# Patient Record
Sex: Male | Born: 1975 | Race: Black or African American | Hispanic: No | Marital: Single | Smoking: Current every day smoker
Health system: Southern US, Community
[De-identification: ages and names within clinical notes are randomized; demographics above are authoritative.]

## PROBLEM LIST (undated history)

## (undated) HISTORY — PX: BRAIN SURGERY: SHX531

## (undated) HISTORY — PX: HYPOSPADIAS CORRECTION: SHX483

---

## 1998-05-12 ENCOUNTER — Emergency Department (HOSPITAL_COMMUNITY): Admission: EM | Admit: 1998-05-12 | Discharge: 1998-05-12 | Payer: Self-pay | Admitting: Emergency Medicine

## 1998-09-23 ENCOUNTER — Encounter: Payer: Self-pay | Admitting: Emergency Medicine

## 1998-09-23 ENCOUNTER — Emergency Department (HOSPITAL_COMMUNITY): Admission: EM | Admit: 1998-09-23 | Discharge: 1998-09-23 | Payer: Self-pay | Admitting: Emergency Medicine

## 1998-12-15 ENCOUNTER — Emergency Department (HOSPITAL_COMMUNITY): Admission: EM | Admit: 1998-12-15 | Discharge: 1998-12-15 | Payer: Self-pay | Admitting: Emergency Medicine

## 2001-09-16 ENCOUNTER — Emergency Department (HOSPITAL_COMMUNITY): Admission: EM | Admit: 2001-09-16 | Discharge: 2001-09-16 | Payer: Self-pay | Admitting: Emergency Medicine

## 2006-07-16 ENCOUNTER — Emergency Department (HOSPITAL_COMMUNITY): Admission: EM | Admit: 2006-07-16 | Discharge: 2006-07-16 | Payer: Self-pay | Admitting: Emergency Medicine

## 2006-08-11 ENCOUNTER — Emergency Department (HOSPITAL_COMMUNITY): Admission: EM | Admit: 2006-08-11 | Discharge: 2006-08-11 | Payer: Self-pay | Admitting: Emergency Medicine

## 2007-08-12 ENCOUNTER — Emergency Department (HOSPITAL_COMMUNITY): Admission: EM | Admit: 2007-08-12 | Discharge: 2007-08-12 | Payer: Self-pay | Admitting: *Deleted

## 2008-06-05 ENCOUNTER — Emergency Department (HOSPITAL_COMMUNITY): Admission: EM | Admit: 2008-06-05 | Discharge: 2008-06-06 | Payer: Self-pay | Admitting: Emergency Medicine

## 2009-12-10 ENCOUNTER — Emergency Department (HOSPITAL_COMMUNITY): Admission: EM | Admit: 2009-12-10 | Discharge: 2009-12-10 | Payer: Self-pay | Admitting: Emergency Medicine

## 2010-07-11 ENCOUNTER — Emergency Department (HOSPITAL_COMMUNITY): Admission: EM | Admit: 2010-07-11 | Discharge: 2010-07-11 | Payer: Self-pay | Admitting: Emergency Medicine

## 2011-06-28 ENCOUNTER — Emergency Department (HOSPITAL_COMMUNITY)
Admission: EM | Admit: 2011-06-28 | Discharge: 2011-06-28 | Disposition: A | Discharge status: Home / Self Care | Payer: Self-pay | Attending: Emergency Medicine | Admitting: Emergency Medicine

## 2011-06-28 DIAGNOSIS — H5789 Other specified disorders of eye and adnexa: Secondary | ICD-10-CM | POA: Insufficient documentation

## 2011-06-28 DIAGNOSIS — H109 Unspecified conjunctivitis: Secondary | ICD-10-CM | POA: Insufficient documentation

## 2011-09-27 LAB — DIFFERENTIAL
Basophils Absolute: 0.1
Basophils Relative: 1
Eosinophils Absolute: 0.2
Eosinophils Relative: 1
Lymphocytes Relative: 6 — ABNORMAL LOW
Lymphs Abs: 1.1
Monocytes Absolute: 1.4 — ABNORMAL HIGH
Monocytes Relative: 8
Neutro Abs: 15.4 — ABNORMAL HIGH
Neutrophils Relative %: 85 — ABNORMAL HIGH

## 2011-09-27 LAB — CBC
HCT: 40
Hemoglobin: 13.6
MCHC: 34
MCV: 100.3 — ABNORMAL HIGH
Platelets: 250
RBC: 3.99 — ABNORMAL LOW
RDW: 13.5
WBC: 18.1 — ABNORMAL HIGH

## 2011-09-27 LAB — RAPID STREP SCREEN (MED CTR MEBANE ONLY): Streptococcus, Group A Screen (Direct): POSITIVE — AB

## 2012-12-19 ENCOUNTER — Emergency Department (HOSPITAL_COMMUNITY)
Admission: EM | Admit: 2012-12-19 | Discharge: 2012-12-19 | Disposition: A | Discharge status: Home / Self Care | Payer: Self-pay | Attending: Emergency Medicine | Admitting: Emergency Medicine

## 2012-12-19 ENCOUNTER — Encounter (HOSPITAL_COMMUNITY): Payer: Self-pay | Admitting: *Deleted

## 2012-12-19 DIAGNOSIS — J069 Acute upper respiratory infection, unspecified: Secondary | ICD-10-CM | POA: Insufficient documentation

## 2012-12-19 DIAGNOSIS — R079 Chest pain, unspecified: Secondary | ICD-10-CM | POA: Insufficient documentation

## 2012-12-19 DIAGNOSIS — R0602 Shortness of breath: Secondary | ICD-10-CM | POA: Insufficient documentation

## 2012-12-19 DIAGNOSIS — J3489 Other specified disorders of nose and nasal sinuses: Secondary | ICD-10-CM | POA: Insufficient documentation

## 2012-12-19 DIAGNOSIS — F17200 Nicotine dependence, unspecified, uncomplicated: Secondary | ICD-10-CM | POA: Insufficient documentation

## 2012-12-19 DIAGNOSIS — R51 Headache: Secondary | ICD-10-CM | POA: Insufficient documentation

## 2012-12-19 DIAGNOSIS — Z7982 Long term (current) use of aspirin: Secondary | ICD-10-CM | POA: Insufficient documentation

## 2012-12-19 MED ORDER — NAPROXEN 500 MG PO TABS: 500.0000 mg | ORAL_TABLET | Freq: Two times a day (BID) | ORAL | Status: DC | PRN

## 2012-12-19 MED ORDER — GUAIFENESIN-CODEINE 100-10 MG/5ML PO SYRP: 10.0000 mL | ORAL_SOLUTION | Freq: Three times a day (TID) | ORAL | Status: DC | PRN

## 2012-12-19 MED ORDER — FEXOFENADINE-PSEUDOEPHED ER 180-240 MG PO TB24: 1.0000 | ORAL_TABLET | Freq: Every day | ORAL | Status: DC

## 2012-12-19 NOTE — ED Notes (Signed)
Woke up Tuesday  Feeling like having a cold. Having pain mostly in head - clogged, congested; also, upper abdominal pain when coughing. Productive cough- brown mucous.

## 2012-12-19 NOTE — ED Provider Notes (Signed)
History   This chart was scribed for Raeford Razor, MD by Sofie Rower, ED Scribe. The patient was seen in room TR10C/TR10C and the patient's care was started at 4:58PM.     CSN: [id number]  Arrival date & time 12/19/12  1509   First MD Initiated Contact with Patient 12/19/12 1658      Chief Complaint  Patient presents with  . Cough  . Cold Exposure    (Consider location/radiation/quality/duration/timing/severity/associated sxs/prior treatment) Patient is a 37 y.o. male presenting with cough. The history is provided by the patient. No language interpreter was used.  Cough This is a new problem. The current episode started more than 2 days ago (4 days ago). The problem occurs every few minutes. The problem has been gradually worsening. The cough is productive of brown sputum. There has been no fever. Associated symptoms include chest pain, headaches and shortness of breath. Treatments tried: alka seltzer cold formula. The treatment provided no relief. He is a smoker.    Kurt Rios is a 37 y.o. male , who presents to the Emergency Department complaining of  sudden, progressively worsening, productive brown cough, onset four days ago (12/15/12).  Associated symptoms include headache, nasal congestion, shortness of breath and chest pain. The pt has taken alka-seltzer cold, which does not provide relief of his cold like symptoms. The pt admits he has had sick contacts (Nephew), with similar symptoms.   The pt denies fever, abdominal pain, rash, and swelling located within the lower extremities. Furthermore, the pt denies any allergies to medications.    The pt is a current everyday smoker, however, he does not drink alcohol.      History reviewed. No pertinent past medical history.  Past Surgical History  Procedure Date  . Brain surgery   . Hypospadias correction     No family history on file.  History  Substance Use Topics  . Smoking status: Current Every Day Smoker  .  Smokeless tobacco: Not on file  . Alcohol Use: No      Review of Systems  Respiratory: Positive for cough and shortness of breath.   Cardiovascular: Positive for chest pain.  Neurological: Positive for headaches.  All other systems reviewed and are negative.    Allergies  Review of patient's allergies indicates no known allergies.  Home Medications   Current Outpatient Rx  Name  Route  Sig  Dispense  Refill  . GOODY HEADACHE PO   Oral   Take 1 packet by mouth daily as needed. For pain         . ALKA-SELTZER PLUS COLD PO   Oral   Take 1 packet by mouth 3 (three) times daily as needed. For pain and cold symptoms           BP 109/75  Temp 98.5 F (36.9 C) (Oral)  Resp 14  SpO2 97%  Physical Exam  Nursing note and vitals reviewed. Constitutional: He appears well-developed and well-nourished. No distress.  HENT:  Head: Normocephalic and atraumatic.       Maxillary sinus tenderness.   Eyes: Conjunctivae normal are normal. Right eye exhibits no discharge. Left eye exhibits no discharge.  Neck: Neck supple.  Cardiovascular: Normal rate, regular rhythm and normal heart sounds.  Exam reveals no gallop and no friction rub.   No murmur heard. Pulmonary/Chest: Effort normal and breath sounds normal. No respiratory distress.  Abdominal: Soft. He exhibits no distension. There is no tenderness.  Musculoskeletal: He exhibits no edema and no  tenderness.  Neurological: He is alert.  Skin: Skin is warm and dry.  Psychiatric: He has a normal mood and affect. His behavior is normal. Thought content normal.    ED Course  Procedures (including critical care time)  DIAGNOSTIC STUDIES: Oxygen Saturation is 97% on room air, normal by my interpretation.    COORDINATION OF CARE:  5:20 PM- Treatment plan discussed with patient. Pt agrees with treatment.      Labs Reviewed - No data to display No results found.   1. URI, acute       MDM  37 year old male with URI  symptoms. Likely viral illness. Very low suspicion for serious bacterial illness. No respiratory distress on exam. Plan symptomatic treatment. Emergent return precautions were discussed. Outpatient followup otherwise.      I personally preformed the services scribed in my presence. The recorded information has been reviewed is accurate. Raeford Razor, MD.    Raeford Razor, MD 12/22/12 [phone]

## 2013-01-09 ENCOUNTER — Encounter (HOSPITAL_COMMUNITY): Payer: Self-pay | Admitting: *Deleted

## 2013-01-09 ENCOUNTER — Emergency Department (HOSPITAL_COMMUNITY)
Admission: EM | Admit: 2013-01-09 | Discharge: 2013-01-09 | Disposition: A | Discharge status: Home / Self Care | Payer: Self-pay | Attending: Emergency Medicine | Admitting: Emergency Medicine

## 2013-01-09 DIAGNOSIS — H109 Unspecified conjunctivitis: Secondary | ICD-10-CM | POA: Insufficient documentation

## 2013-01-09 DIAGNOSIS — F17200 Nicotine dependence, unspecified, uncomplicated: Secondary | ICD-10-CM | POA: Insufficient documentation

## 2013-01-09 DIAGNOSIS — H538 Other visual disturbances: Secondary | ICD-10-CM | POA: Insufficient documentation

## 2013-01-09 MED ORDER — ERYTHROMYCIN 5 MG/GM OP OINT
TOPICAL_OINTMENT | Freq: Four times a day (QID) | OPHTHALMIC | Status: DC
  Administered 2013-01-09: 1 via OPHTHALMIC
  Filled 2013-01-09: qty 1

## 2013-01-09 NOTE — ED Provider Notes (Signed)
Medical screening examination/treatment/procedure(s) were performed by non-physician practitioner and as supervising physician I was immediately available for consultation/collaboration.  Johnny Gorter, MD 01/09/13 0552 

## 2013-01-09 NOTE — ED Notes (Signed)
Rt. Eye: sclera is redness, some swelling; believes it is pink eye. Woke up with a swelling shut eye.  No blurry vision.

## 2013-01-09 NOTE — ED Provider Notes (Signed)
History     CSN: [id number]  Arrival date & time 01/09/13  0158   First MD Initiated Contact with Patient 01/09/13 0208      Chief Complaint  Patient presents with  . Conjunctivitis    (Consider location/radiation/quality/duration/timing/severity/associated sxs/prior treatment) HPI  She presents to the emergency department with complaints of right eye redness, some mild swelling, itching to the eye, and waking up with his eye swollen shut yesterday morning. Informs me that he has had pinkeye before and feels like this is the same is not having pain to the eye, decreased vision, decreased range of motion. He said that he had to wash his eye to be able to get it open. 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 nad vss  History reviewed. No pertinent past medical history.  Past Surgical History  Procedure Date  . Brain surgery   . Hypospadias correction     No family history on file.  History  Substance Use Topics  . Smoking status: Current Every Day Smoker  . Smokeless tobacco: Not on file  . Alcohol Use: No      Review of Systems  All other systems reviewed and are negative.    Allergies  Review of patient's allergies indicates no known allergies.  Home Medications   Current Outpatient Rx  Name  Route  Sig  Dispense  Refill  . GOODY HEADACHE PO   Oral   Take 1 packet by mouth daily as needed. For pain           BP 111/74  Pulse 84  Temp 98.8 F (37.1 C) (Oral)  Resp 16  SpO2 98%  Physical Exam  Nursing note and vitals reviewed. Constitutional: He appears well-developed and well-nourished. No distress.  HENT:  Head: Normocephalic and atraumatic.  Eyes: EOM and lids are normal. Pupils are equal, round, and reactive to light. No foreign bodies found. Right conjunctiva is injected. Right conjunctiva has no hemorrhage.       Pt has a small amount of discharge that has dried in his eyelashes. No pain.    Neck: Normal range of motion. Neck supple.  Cardiovascular: Normal rate and regular rhythm.   Pulmonary/Chest: Effort normal.  Abdominal: Soft.  Neurological: He is alert.  Skin: Skin is warm and dry.    ED Course  Procedures (including critical care time)  Labs Reviewed - No data to display No results found.   1. Conjunctivitis       MDM  And advised to use warm moist compresses frequently. 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 I prescribed erythromycin ophthalmic ointment.  IT has been explained how contagious this is.   Pt has been advised of the symptoms that warrant their return to the ED. Patient has voiced understanding and has agreed to follow-up with the PCP or specialist.         Dorthula Matas, PA 01/09/13 [phone]

## 2013-04-12 ENCOUNTER — Encounter (HOSPITAL_COMMUNITY): Payer: Self-pay | Admitting: Emergency Medicine

## 2013-04-12 ENCOUNTER — Emergency Department (INDEPENDENT_AMBULATORY_CARE_PROVIDER_SITE_OTHER)
Admission: EM | Admit: 2013-04-12 | Discharge: 2013-04-12 | Disposition: A | Discharge status: Home / Self Care | Payer: Self-pay | Source: Home / Self Care | Attending: Emergency Medicine | Admitting: Emergency Medicine

## 2013-04-12 DIAGNOSIS — L0291 Cutaneous abscess, unspecified: Secondary | ICD-10-CM

## 2013-04-12 MED ORDER — SULFAMETHOXAZOLE-TRIMETHOPRIM 800-160 MG PO TABS: 1.0000 | ORAL_TABLET | Freq: Two times a day (BID) | ORAL | Status: AC

## 2013-04-12 MED ORDER — HYDROCODONE-ACETAMINOPHEN 5-325 MG PO TABS: 1.0000 | ORAL_TABLET | Freq: Four times a day (QID) | ORAL | Status: DC | PRN

## 2013-04-12 NOTE — ED Notes (Signed)
Pt c/o abscess near pubic area onset 2 days Hot baths alleviate the pain Pain is constant; not taking any meds Denies: f/v/nd  He is alert and oriented w/no signs of acute distress.

## 2013-04-12 NOTE — ED Provider Notes (Signed)
Medical screening examination/treatment/procedure(s) were performed by non-physician practitioner and as supervising physician I was immediately available for consultation/collaboration.  Raynald Blend, MD 04/12/13 [phone]

## 2013-04-12 NOTE — ED Provider Notes (Signed)
History     CSN: [id number]  Arrival date & time 04/12/13  1420   First MD Initiated Contact with Patient 04/12/13 1551      Chief Complaint  Patient presents with  . Abscess    (Consider location/radiation/quality/duration/timing/severity/associated sxs/prior treatment) HPI Comments: Pt with abscess in pubic hair just above pubic bone for 2 days.   Patient is a 37 y.o. male presenting with abscess. The history is provided by the patient.  Abscess Location:  Ano-genital Ano-genital abscess location: middle pubic bone area. Abscess quality: induration, painful and warmth   Abscess quality: not draining and no fluctuance   Red streaking: no   Duration:  2 days Progression:  Worsening Pain details:    Quality:  Aching   Severity:  Severe Relieved by:  Warm compresses Worsened by:  Draining/squeezing Ineffective treatments:  Draining/squeezing Associated symptoms: no fever   Risk factors: no prior abscess     History reviewed. No pertinent past medical history.  Past Surgical History  Procedure Laterality Date  . Brain surgery    . Hypospadias correction      History reviewed. No pertinent family history.  History  Substance Use Topics  . Smoking status: Current Every Day Smoker  . Smokeless tobacco: Not on file  . Alcohol Use: No      Review of Systems  Constitutional: Negative for fever and chills.  Skin:       Abscess    Allergies  Review of patient's allergies indicates no known allergies.  Home Medications   Current Outpatient Rx  Name  Route  Sig  Dispense  Refill  . Aspirin-Acetaminophen-Caffeine (GOODY HEADACHE PO)   Oral   Take 1 packet by mouth daily as needed. For pain         . HYDROcodone-acetaminophen (NORCO/VICODIN) 5-325 MG per tablet   Oral   Take 1-2 tablets by mouth every 6 (six) hours as needed for pain.   10 tablet   0   . sulfamethoxazole-trimethoprim (SEPTRA DS) 800-160 MG per tablet   Oral   Take 1 tablet by mouth 2  (two) times daily.   20 tablet   0     BP 140/72  Pulse 86  Temp(Src) 98 F (36.7 C) (Oral)  Resp 16  SpO2 100%  Physical Exam  Constitutional: He appears well-developed and well-nourished.  Appears in pain  Skin: Skin is warm and dry.       ED Course  Procedures (including critical care time)  Labs Reviewed - No data to display No results found.   1. Abscess       MDM  Elected to defer incision and drainage as there is no area of fluctuance in abscess.  Pt is to use warm compresses to area several times a day and return here in 2 days for I&D.  Rx bactrim 800/160 BID for 10 days and hydrocodone 5/325 #10 for pain.         Cathlyn Parsons, NP 04/12/13 [phone]

## 2014-10-29 ENCOUNTER — Encounter (HOSPITAL_COMMUNITY): Payer: Self-pay | Admitting: Nurse Practitioner

## 2014-10-29 ENCOUNTER — Emergency Department (HOSPITAL_COMMUNITY): Payer: No Typology Code available for payment source

## 2014-10-29 ENCOUNTER — Emergency Department (HOSPITAL_COMMUNITY)
Admission: EM | Admit: 2014-10-29 | Discharge: 2014-10-29 | Disposition: A | Discharge status: Home / Self Care | Payer: No Typology Code available for payment source | Attending: Emergency Medicine | Admitting: Emergency Medicine

## 2014-10-29 DIAGNOSIS — Z792 Long term (current) use of antibiotics: Secondary | ICD-10-CM | POA: Insufficient documentation

## 2014-10-29 DIAGNOSIS — S199XXA Unspecified injury of neck, initial encounter: Secondary | ICD-10-CM

## 2014-10-29 DIAGNOSIS — Y92410 Unspecified street and highway as the place of occurrence of the external cause: Secondary | ICD-10-CM | POA: Insufficient documentation

## 2014-10-29 DIAGNOSIS — Z720 Tobacco use: Secondary | ICD-10-CM | POA: Insufficient documentation

## 2014-10-29 DIAGNOSIS — Y998 Other external cause status: Secondary | ICD-10-CM | POA: Insufficient documentation

## 2014-10-29 DIAGNOSIS — S20219A Contusion of unspecified front wall of thorax, initial encounter: Secondary | ICD-10-CM | POA: Insufficient documentation

## 2014-10-29 DIAGNOSIS — S4991XA Unspecified injury of right shoulder and upper arm, initial encounter: Secondary | ICD-10-CM | POA: Insufficient documentation

## 2014-10-29 DIAGNOSIS — S20211A Contusion of right front wall of thorax, initial encounter: Secondary | ICD-10-CM

## 2014-10-29 DIAGNOSIS — Z7982 Long term (current) use of aspirin: Secondary | ICD-10-CM | POA: Insufficient documentation

## 2014-10-29 DIAGNOSIS — Y9389 Activity, other specified: Secondary | ICD-10-CM | POA: Insufficient documentation

## 2014-10-29 MED ORDER — NAPROXEN 500 MG PO TABS: 500.0000 mg | ORAL_TABLET | Freq: Two times a day (BID) | ORAL | Status: AC

## 2014-10-29 MED ORDER — HYDROCODONE-ACETAMINOPHEN 5-325 MG PO TABS: 1.0000 | ORAL_TABLET | Freq: Four times a day (QID) | ORAL | Status: AC | PRN

## 2014-10-29 MED ORDER — CYCLOBENZAPRINE HCL 10 MG PO TABS: 10.0000 mg | ORAL_TABLET | Freq: Two times a day (BID) | ORAL | Status: AC | PRN
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
## 2017-02-08 ENCOUNTER — Emergency Department (HOSPITAL_COMMUNITY)
Admission: EM | Admit: 2017-02-08 | Discharge: 2017-02-08 | Disposition: A | Discharge status: Home / Self Care | Payer: Self-pay | Attending: Emergency Medicine | Admitting: Emergency Medicine

## 2017-02-08 ENCOUNTER — Encounter (HOSPITAL_COMMUNITY): Payer: Self-pay | Admitting: Neurology

## 2017-02-08 DIAGNOSIS — Z7982 Long term (current) use of aspirin: Secondary | ICD-10-CM | POA: Insufficient documentation

## 2017-02-08 DIAGNOSIS — H0011 Chalazion right upper eyelid: Secondary | ICD-10-CM | POA: Insufficient documentation

## 2017-02-08 DIAGNOSIS — R21 Rash and other nonspecific skin eruption: Secondary | ICD-10-CM | POA: Insufficient documentation

## 2017-02-08 DIAGNOSIS — F17200 Nicotine dependence, unspecified, uncomplicated: Secondary | ICD-10-CM | POA: Insufficient documentation

## 2017-02-08 MED ORDER — TETRACAINE HCL 0.5 % OP SOLN
2.0000 [drp] | Freq: Once | OPHTHALMIC | Status: AC
  Administered 2017-02-08: 2 [drp] via OPHTHALMIC
  Filled 2017-02-08: qty 2

## 2017-02-08 MED ORDER — FLUORESCEIN SODIUM 0.6 MG OP STRP
1.0000 | ORAL_STRIP | Freq: Once | OPHTHALMIC | Status: AC
  Administered 2017-02-08: 1 via OPHTHALMIC
  Filled 2017-02-08: qty 1

## 2017-02-08 NOTE — ED Triage Notes (Signed)
Pt reports right eye drainage, itchy, and painful for several weeks. In the morning he has crust in his eye and thinks he may need an antibiotic.

## 2017-02-08 NOTE — ED Notes (Signed)
Declined W/C at D/C and was escorted to lobby by RN. 

## 2017-02-08 NOTE — ED Provider Notes (Signed)
[facility]-EMERGENCY DEPT Provider Note   CSN: [id number] Arrival date & time: 02/08/17  4098   By signing my name below, I, [name], attest that this documentation has been prepared under the direction and in the presence of Fayrene Helper, PA-C . Electronically Signed: [name], Scribe. 02/08/17. 10:46 AM.   History   Chief Complaint Chief Complaint  Patient presents with  . Eye Drainage   The history is provided by the patient and medical records. No language interpreter was used.    HPI Comments: Kurt Rios is a 41 y.o. male who presents to the Emergency Department complaining of waxing waning upper right eyelid discomfort x 2-3 weeks. Pt notes associated crusty clear drainage present in the morning, intermittent clear drainage throughout the day, itchiness and pain. Pt notes he has treated the eye with stye cream and warm compresses without relief. Pt states he has smoked 1 pack of cigarettes/ day x 27 years. No previous dx'es, daily medications or allergy noted. Pt also reports an itchy rash to the left shoulder, back, chest and sternum x 2 months. Pt denies fever, N/V/D, SOB, headache or new products at home.   History reviewed. No pertinent past medical history.  There are no active problems to display for this patient.   Past Surgical History:  Procedure Laterality Date  . BRAIN SURGERY    . HYPOSPADIAS CORRECTION         Home Medications    Prior to Admission medications   Medication Sig Start Date End Date Taking? Authorizing Provider  Aspirin-Acetaminophen-Caffeine (GOODY HEADACHE PO) Take 1 packet by mouth daily as needed. For pain    Historical Provider, MD  cyclobenzaprine (FLEXERIL) 10 MG tablet Take 1 tablet (10 mg total) by mouth 2 (two) times daily as needed for muscle spasms. 10/29/14   Arthor Captain, PA-C  HYDROcodone-acetaminophen (NORCO/VICODIN) 5-325 MG per tablet Take 1-2 tablets by mouth every 6 (six) hours as needed. 10/29/14   Arthor Captain, PA-C  naproxen (NAPROSYN) 500 MG tablet Take 1 tablet (500 mg total) by mouth 2 (two) times daily with a meal. 10/29/14   Arthor Captain, PA-C  sulfamethoxazole-trimethoprim (SEPTRA DS) 800-160 MG per tablet Take 1 tablet by mouth 2 (two) times daily. 04/12/13   Cathlyn Parsons, NP    Family History No family history on file.  Social History Social History  Substance Use Topics  . Smoking status: Current Every Day Smoker  . Smokeless tobacco: Not on file  . Alcohol use No     Allergies   Patient has no known allergies.   Review of Systems Review of Systems  Constitutional: Negative for chills and fever.  Eyes: Positive for pain, discharge and itching. Negative for photophobia, redness and visual disturbance.  Respiratory: Negative for shortness of breath.   Gastrointestinal: Negative for diarrhea, nausea and vomiting.  Skin: Positive for rash.  Neurological: Negative for headaches.     Physical Exam Updated Vital Signs BP 108/89 (BP Location: Left Arm)   Pulse 107   Temp 98.4 F (36.9 C) (Oral)   Resp 18   Ht 5\' 9"  (1.753 m)   Wt 150 lb (68 kg)   SpO2 97%   BMI 22.15 kg/m   Physical Exam  Constitutional: He is oriented to person, place, and time. He appears well-developed and well-nourished.  HENT:  Head: Normocephalic and atraumatic.  Eyes: Conjunctivae and EOM are normal. Pupils are equal, round, and reactive to light. Right eye exhibits no discharge and  no exudate. No foreign body present in the right eye. Left eye exhibits no discharge and no exudate. No foreign body present in the left eye.  R upper eyelid with area of induration involving the lateral aspect of eyelid without eyelid involvement; No erythema, foreign foreign bodies or discharge noted; no enlargement of lacrimal glands noted; No corneal abrasion, dendritic lesion or corneal ulcer noted  Neck: Normal range of motion. Neck supple. No JVD present.  Cardiovascular: Normal rate and regular  rhythm.  Exam reveals no gallop and no friction rub.   No murmur heard. Pulmonary/Chest: No respiratory distress. He has no wheezes.  Abdominal: He exhibits no distension. There is no rebound and no guarding.  Musculoskeletal: Normal range of motion.  Neurological: He is alert and oriented to person, place, and time.  Skin: Rash noted. No pallor.  Bilateral upper arms with multiple 2-3 mm, hyper-pigmented, macular skin changes without erythema or abscesses.  Psychiatric: He has a normal mood and affect. His behavior is normal.  Nursing note and vitals reviewed.        ED Treatments / Results  DIAGNOSTIC STUDIES: Oxygen Saturation is 97% on RA, adequate by my interpretation.    COORDINATION OF CARE: 10:39 AM Discussed treatment plan with pt at bedside and pt agreed to plan. Will refer to eye specialist. Pt advised on symptomatic care at home.  10:40 AM Eye stained.  Labs (all labs ordered are listed, but only abnormal results are displayed) Labs Reviewed - No data to display  EKG  EKG Interpretation None       Radiology No results found.  Procedures Procedures (including critical care time)  Medications Ordered in ED Medications  tetracaine (PONTOCAINE) 0.5 % ophthalmic solution 2 drop (2 drops Right Eye Given 02/08/17 1027)  fluorescein ophthalmic strip 1 strip (1 strip Both Eyes Given 02/08/17 1027)     Initial Impression / Assessment and Plan / ED Course  I have reviewed the triage vital signs and the nursing notes.  Pertinent labs & imaging results that were available during my care of the patient were reviewed by me and considered in my medical decision making (see chart for details).     I personally performed the services described in this documentation, which was scribed in my presence. The recorded information has been reviewed and is accurate.     Final Clinical Impressions(s) / ED Diagnoses   Final diagnoses:  Chalazion right upper eyelid  Rash  and nonspecific skin eruption    New Prescriptions New Prescriptions   No medications on file   10:51 AM Pt here with painless swelling to right upper eyelid consistence with a chalazion. Reassurance given. Recommend using warm compress as needed. Ophthalmology referral given as needed. Patient also complaining of an itchy rash. Rash is nonspecific but does not appears to be infection and there are no systemic manifestation. Encourage trimming nails to decrease risk of skin infection from scratching, patient may use over-the-counter 1% hydrocortisone cream as needed but to avoid using on face. Return precaution discussed.   Fayrene HelperBowie Hayde Kilgour, PA-C 02/08/17 1053    Raeford RazorStephen Kohut, MD 02/24/17 [phone]

## 2017-02-08 NOTE — Discharge Instructions (Signed)
What is a chalazion? -- A chalazion is a painless lump in the eyelid. It is caused by a blockage in a gland that makes tears.  A chalazion is different from a "stye," which also causes a lump on the eyelid. But a stye is caused by an infection and is painful. A chalazion is not tender or painful, but it often lasts longer than a stye does.  What are the symptoms of a chalazion? -- A chalazion often begins with redness and swelling. Then, a firm, painless lump forms, usually in the upper eyelid. The lump can be as large as a pea. If a chalazion forms in the lower eyelid, it looks like a yellow-white bump (picture 1).  Is there a test for a chalazion? -- No. But your doctor or nurse should be able to tell if you have a chalazion by doing an exam and talking with you.  Is there anything I can do on my own to treat this? -- Yes. You can put warm, wet pressure on the chalazion. Wet a clean washcloth with warm water and put it over your chalazion. When the washcloth cools, reheat it with warm water and put it back over the chalazion. Repeat these steps for 15 minutes, 4 times a day.  You should not squeeze or pop your chalazion.  How is chalazion treated? -- Most of the time, a chalazion goes away with the treatment described above within a few weeks. It might even go away on its own. But if you have a large chalazion that does not go away or keeps coming back, your doctor might refer you to an eye doctor. He or she can do a procedure in the office to remove the chalazion or inject a medicine into the chalazion to reduce the swelling.
